# Patient Record
Sex: Male | Born: 1950 | Race: Black or African American | Hispanic: No | Marital: Single | Smoking: Never smoker
Health system: Southern US, Community
[De-identification: ages and names within clinical notes are randomized; demographics above are authoritative.]

---

## 2009-06-09 ENCOUNTER — Emergency Department (HOSPITAL_COMMUNITY): Admission: EM | Admit: 2009-06-09 | Discharge: 2009-06-09 | Payer: Self-pay | Admitting: Emergency Medicine

## 2010-07-05 LAB — URINALYSIS, ROUTINE W REFLEX MICROSCOPIC
Bilirubin Urine: NEGATIVE
Leukocytes, UA: NEGATIVE
Specific Gravity, Urine: 1.023 (ref 1.005–1.030)

## 2010-07-05 LAB — URINE MICROSCOPIC-ADD ON

## 2012-08-27 ENCOUNTER — Encounter (HOSPITAL_COMMUNITY): Payer: Self-pay | Admitting: Emergency Medicine

## 2012-08-27 ENCOUNTER — Emergency Department (HOSPITAL_COMMUNITY)
Admission: EM | Admit: 2012-08-27 | Discharge: 2012-08-27 | Discharge status: Against Medical Advice | Payer: Commercial Managed Care - PPO | Attending: Emergency Medicine | Admitting: Emergency Medicine

## 2012-08-27 DIAGNOSIS — Y9389 Activity, other specified: Secondary | ICD-10-CM | POA: Insufficient documentation

## 2012-08-27 DIAGNOSIS — Y929 Unspecified place or not applicable: Secondary | ICD-10-CM | POA: Insufficient documentation

## 2012-08-27 DIAGNOSIS — S91309A Unspecified open wound, unspecified foot, initial encounter: Secondary | ICD-10-CM | POA: Insufficient documentation

## 2012-08-27 DIAGNOSIS — W268XXA Contact with other sharp object(s), not elsewhere classified, initial encounter: Secondary | ICD-10-CM | POA: Insufficient documentation

## 2012-08-27 NOTE — ED Notes (Signed)
Patient is very questioning in triage states that he is does not want anything but a tetanus shot. He reports he does not want anything but the shot and he refuses everything else. Made the patient aware that he will need to be triaged to see a dr for the tetanus shot.

## 2012-08-27 NOTE — ED Notes (Signed)
Pt to window x 3 stating he could not wait and will go to doctor's office in the morning

## 2012-08-27 NOTE — ED Notes (Signed)
Patient  Reports that he stepped on a nail a few minuets ago and he wants a tetanus shot

## 2015-03-04 ENCOUNTER — Other Ambulatory Visit: Payer: Commercial Managed Care - PPO

## 2015-03-04 ENCOUNTER — Other Ambulatory Visit: Payer: Self-pay | Admitting: Internal Medicine

## 2015-03-04 DIAGNOSIS — L97909 Non-pressure chronic ulcer of unspecified part of unspecified lower leg with unspecified severity: Secondary | ICD-10-CM

## 2015-03-09 ENCOUNTER — Other Ambulatory Visit: Payer: Self-pay | Admitting: Internal Medicine

## 2015-03-09 DIAGNOSIS — L97909 Non-pressure chronic ulcer of unspecified part of unspecified lower leg with unspecified severity: Secondary | ICD-10-CM

## 2015-03-17 ENCOUNTER — Other Ambulatory Visit: Payer: Commercial Managed Care - PPO

## 2015-04-15 ENCOUNTER — Other Ambulatory Visit: Payer: Commercial Managed Care - PPO

## 2016-02-10 ENCOUNTER — Other Ambulatory Visit: Payer: Self-pay | Admitting: Internal Medicine

## 2016-02-10 DIAGNOSIS — R319 Hematuria, unspecified: Secondary | ICD-10-CM

## 2016-02-23 ENCOUNTER — Ambulatory Visit
Admission: RE | Admit: 2016-02-23 | Discharge: 2016-02-23 | Disposition: A | Discharge status: Home / Self Care | Payer: BLUE CROSS/BLUE SHIELD | Source: Ambulatory Visit | Attending: Internal Medicine | Admitting: Internal Medicine

## 2016-02-23 DIAGNOSIS — R319 Hematuria, unspecified: Secondary | ICD-10-CM

## 2017-02-09 DIAGNOSIS — I8310 Varicose veins of unspecified lower extremity with inflammation: Secondary | ICD-10-CM | POA: Diagnosis not present

## 2017-02-09 DIAGNOSIS — I10 Essential (primary) hypertension: Secondary | ICD-10-CM | POA: Diagnosis not present

## 2017-02-09 DIAGNOSIS — R600 Localized edema: Secondary | ICD-10-CM | POA: Diagnosis not present

## 2017-03-28 DIAGNOSIS — I8310 Varicose veins of unspecified lower extremity with inflammation: Secondary | ICD-10-CM | POA: Diagnosis not present

## 2017-03-28 DIAGNOSIS — R600 Localized edema: Secondary | ICD-10-CM | POA: Diagnosis not present

## 2017-03-28 DIAGNOSIS — Z131 Encounter for screening for diabetes mellitus: Secondary | ICD-10-CM | POA: Diagnosis not present

## 2017-03-28 DIAGNOSIS — Z125 Encounter for screening for malignant neoplasm of prostate: Secondary | ICD-10-CM | POA: Diagnosis not present

## 2017-03-28 DIAGNOSIS — N390 Urinary tract infection, site not specified: Secondary | ICD-10-CM | POA: Diagnosis not present

## 2017-03-28 DIAGNOSIS — Z130 Encounter for screening for diseases of the blood and blood-forming organs and certain disorders involving the immune mechanism: Secondary | ICD-10-CM | POA: Diagnosis not present

## 2017-03-28 DIAGNOSIS — I10 Essential (primary) hypertension: Secondary | ICD-10-CM | POA: Diagnosis not present

## 2017-04-26 DIAGNOSIS — R600 Localized edema: Secondary | ICD-10-CM | POA: Diagnosis not present

## 2017-04-26 DIAGNOSIS — Z7189 Other specified counseling: Secondary | ICD-10-CM | POA: Diagnosis not present

## 2017-04-26 DIAGNOSIS — I10 Essential (primary) hypertension: Secondary | ICD-10-CM | POA: Diagnosis not present

## 2017-04-26 DIAGNOSIS — E782 Mixed hyperlipidemia: Secondary | ICD-10-CM | POA: Diagnosis not present

## 2017-04-26 DIAGNOSIS — Z0000 Encounter for general adult medical examination without abnormal findings: Secondary | ICD-10-CM | POA: Diagnosis not present

## 2017-06-06 DIAGNOSIS — I10 Essential (primary) hypertension: Secondary | ICD-10-CM | POA: Diagnosis not present

## 2017-06-06 DIAGNOSIS — E782 Mixed hyperlipidemia: Secondary | ICD-10-CM | POA: Diagnosis not present

## 2017-07-20 DIAGNOSIS — I10 Essential (primary) hypertension: Secondary | ICD-10-CM | POA: Diagnosis not present

## 2017-07-20 DIAGNOSIS — E782 Mixed hyperlipidemia: Secondary | ICD-10-CM | POA: Diagnosis not present

## 2017-07-26 DIAGNOSIS — R600 Localized edema: Secondary | ICD-10-CM | POA: Diagnosis not present

## 2017-07-26 DIAGNOSIS — E782 Mixed hyperlipidemia: Secondary | ICD-10-CM | POA: Diagnosis not present

## 2017-07-26 DIAGNOSIS — I10 Essential (primary) hypertension: Secondary | ICD-10-CM | POA: Diagnosis not present

## 2017-07-26 DIAGNOSIS — I872 Venous insufficiency (chronic) (peripheral): Secondary | ICD-10-CM | POA: Diagnosis not present

## 2017-12-21 DIAGNOSIS — E782 Mixed hyperlipidemia: Secondary | ICD-10-CM | POA: Diagnosis not present

## 2017-12-21 DIAGNOSIS — I10 Essential (primary) hypertension: Secondary | ICD-10-CM | POA: Diagnosis not present

## 2017-12-21 DIAGNOSIS — I872 Venous insufficiency (chronic) (peripheral): Secondary | ICD-10-CM | POA: Diagnosis not present

## 2018-01-03 DIAGNOSIS — E782 Mixed hyperlipidemia: Secondary | ICD-10-CM | POA: Diagnosis not present

## 2018-01-03 DIAGNOSIS — I872 Venous insufficiency (chronic) (peripheral): Secondary | ICD-10-CM | POA: Diagnosis not present

## 2018-01-03 DIAGNOSIS — R600 Localized edema: Secondary | ICD-10-CM | POA: Diagnosis not present

## 2018-01-03 DIAGNOSIS — I10 Essential (primary) hypertension: Secondary | ICD-10-CM | POA: Diagnosis not present

## 2018-05-15 DIAGNOSIS — E782 Mixed hyperlipidemia: Secondary | ICD-10-CM | POA: Diagnosis not present

## 2018-05-15 DIAGNOSIS — I10 Essential (primary) hypertension: Secondary | ICD-10-CM | POA: Diagnosis not present

## 2018-05-15 DIAGNOSIS — I872 Venous insufficiency (chronic) (peripheral): Secondary | ICD-10-CM | POA: Diagnosis not present

## 2018-05-15 DIAGNOSIS — Z125 Encounter for screening for malignant neoplasm of prostate: Secondary | ICD-10-CM | POA: Diagnosis not present

## 2018-05-15 DIAGNOSIS — R600 Localized edema: Secondary | ICD-10-CM | POA: Diagnosis not present

## 2018-05-15 DIAGNOSIS — Z131 Encounter for screening for diabetes mellitus: Secondary | ICD-10-CM | POA: Diagnosis not present

## 2018-05-22 DIAGNOSIS — I10 Essential (primary) hypertension: Secondary | ICD-10-CM | POA: Diagnosis not present

## 2018-05-22 DIAGNOSIS — R600 Localized edema: Secondary | ICD-10-CM | POA: Diagnosis not present

## 2018-05-22 DIAGNOSIS — E782 Mixed hyperlipidemia: Secondary | ICD-10-CM | POA: Diagnosis not present

## 2018-05-22 DIAGNOSIS — Z0000 Encounter for general adult medical examination without abnormal findings: Secondary | ICD-10-CM | POA: Diagnosis not present

## 2018-09-12 DIAGNOSIS — R600 Localized edema: Secondary | ICD-10-CM | POA: Diagnosis not present

## 2018-09-12 DIAGNOSIS — Z7189 Other specified counseling: Secondary | ICD-10-CM | POA: Diagnosis not present

## 2018-09-12 DIAGNOSIS — I10 Essential (primary) hypertension: Secondary | ICD-10-CM | POA: Diagnosis not present

## 2018-09-12 DIAGNOSIS — I8310 Varicose veins of unspecified lower extremity with inflammation: Secondary | ICD-10-CM | POA: Diagnosis not present

## 2018-11-06 DIAGNOSIS — R600 Localized edema: Secondary | ICD-10-CM | POA: Diagnosis not present

## 2018-11-06 DIAGNOSIS — E782 Mixed hyperlipidemia: Secondary | ICD-10-CM | POA: Diagnosis not present

## 2018-11-06 DIAGNOSIS — I10 Essential (primary) hypertension: Secondary | ICD-10-CM | POA: Diagnosis not present

## 2018-11-13 DIAGNOSIS — Z7189 Other specified counseling: Secondary | ICD-10-CM | POA: Diagnosis not present

## 2018-11-13 DIAGNOSIS — I872 Venous insufficiency (chronic) (peripheral): Secondary | ICD-10-CM | POA: Diagnosis not present

## 2018-11-13 DIAGNOSIS — R600 Localized edema: Secondary | ICD-10-CM | POA: Diagnosis not present

## 2018-11-13 DIAGNOSIS — I10 Essential (primary) hypertension: Secondary | ICD-10-CM | POA: Diagnosis not present

## 2018-11-13 DIAGNOSIS — N183 Chronic kidney disease, stage 3 (moderate): Secondary | ICD-10-CM | POA: Diagnosis not present

## 2019-02-19 DIAGNOSIS — I872 Venous insufficiency (chronic) (peripheral): Secondary | ICD-10-CM | POA: Diagnosis not present

## 2019-02-19 DIAGNOSIS — I10 Essential (primary) hypertension: Secondary | ICD-10-CM | POA: Diagnosis not present

## 2019-02-19 DIAGNOSIS — R600 Localized edema: Secondary | ICD-10-CM | POA: Diagnosis not present

## 2019-02-26 DIAGNOSIS — I872 Venous insufficiency (chronic) (peripheral): Secondary | ICD-10-CM | POA: Diagnosis not present

## 2019-02-26 DIAGNOSIS — I10 Essential (primary) hypertension: Secondary | ICD-10-CM | POA: Diagnosis not present

## 2019-02-26 DIAGNOSIS — I8310 Varicose veins of unspecified lower extremity with inflammation: Secondary | ICD-10-CM | POA: Diagnosis not present

## 2019-02-26 DIAGNOSIS — E782 Mixed hyperlipidemia: Secondary | ICD-10-CM | POA: Diagnosis not present

## 2019-02-26 DIAGNOSIS — R600 Localized edema: Secondary | ICD-10-CM | POA: Diagnosis not present

## 2019-07-30 DIAGNOSIS — E782 Mixed hyperlipidemia: Secondary | ICD-10-CM | POA: Diagnosis not present

## 2019-07-30 DIAGNOSIS — I10 Essential (primary) hypertension: Secondary | ICD-10-CM | POA: Diagnosis not present

## 2019-07-30 DIAGNOSIS — I8310 Varicose veins of unspecified lower extremity with inflammation: Secondary | ICD-10-CM | POA: Diagnosis not present

## 2019-07-30 DIAGNOSIS — Z131 Encounter for screening for diabetes mellitus: Secondary | ICD-10-CM | POA: Diagnosis not present

## 2019-07-30 DIAGNOSIS — R739 Hyperglycemia, unspecified: Secondary | ICD-10-CM | POA: Diagnosis not present

## 2019-07-30 DIAGNOSIS — R600 Localized edema: Secondary | ICD-10-CM | POA: Diagnosis not present

## 2019-07-30 DIAGNOSIS — R39198 Other difficulties with micturition: Secondary | ICD-10-CM | POA: Diagnosis not present

## 2019-08-07 DIAGNOSIS — R600 Localized edema: Secondary | ICD-10-CM | POA: Diagnosis not present

## 2019-08-07 DIAGNOSIS — I10 Essential (primary) hypertension: Secondary | ICD-10-CM | POA: Diagnosis not present

## 2019-08-07 DIAGNOSIS — I872 Venous insufficiency (chronic) (peripheral): Secondary | ICD-10-CM | POA: Diagnosis not present

## 2019-08-07 DIAGNOSIS — Z0000 Encounter for general adult medical examination without abnormal findings: Secondary | ICD-10-CM | POA: Diagnosis not present

## 2019-09-04 DIAGNOSIS — N390 Urinary tract infection, site not specified: Secondary | ICD-10-CM | POA: Diagnosis not present

## 2019-09-04 DIAGNOSIS — R319 Hematuria, unspecified: Secondary | ICD-10-CM | POA: Diagnosis not present

## 2019-09-11 DIAGNOSIS — R600 Localized edema: Secondary | ICD-10-CM | POA: Diagnosis not present

## 2019-09-11 DIAGNOSIS — R319 Hematuria, unspecified: Secondary | ICD-10-CM | POA: Diagnosis not present

## 2019-09-11 DIAGNOSIS — I10 Essential (primary) hypertension: Secondary | ICD-10-CM | POA: Diagnosis not present

## 2019-11-07 DIAGNOSIS — R319 Hematuria, unspecified: Secondary | ICD-10-CM | POA: Diagnosis not present

## 2019-11-07 DIAGNOSIS — R311 Benign essential microscopic hematuria: Secondary | ICD-10-CM | POA: Diagnosis not present

## 2019-12-10 DIAGNOSIS — R311 Benign essential microscopic hematuria: Secondary | ICD-10-CM | POA: Diagnosis not present

## 2019-12-17 DIAGNOSIS — R311 Benign essential microscopic hematuria: Secondary | ICD-10-CM | POA: Diagnosis not present

## 2020-03-11 DIAGNOSIS — I10 Essential (primary) hypertension: Secondary | ICD-10-CM | POA: Diagnosis not present

## 2020-03-11 DIAGNOSIS — R600 Localized edema: Secondary | ICD-10-CM | POA: Diagnosis not present

## 2020-03-18 DIAGNOSIS — N182 Chronic kidney disease, stage 2 (mild): Secondary | ICD-10-CM | POA: Diagnosis not present

## 2020-03-18 DIAGNOSIS — N029 Recurrent and persistent hematuria with unspecified morphologic changes: Secondary | ICD-10-CM | POA: Diagnosis not present

## 2020-03-18 DIAGNOSIS — E782 Mixed hyperlipidemia: Secondary | ICD-10-CM | POA: Diagnosis not present

## 2020-03-18 DIAGNOSIS — I8310 Varicose veins of unspecified lower extremity with inflammation: Secondary | ICD-10-CM | POA: Diagnosis not present

## 2020-08-12 DIAGNOSIS — I10 Essential (primary) hypertension: Secondary | ICD-10-CM | POA: Diagnosis not present

## 2020-08-12 DIAGNOSIS — N182 Chronic kidney disease, stage 2 (mild): Secondary | ICD-10-CM | POA: Diagnosis not present

## 2020-08-12 DIAGNOSIS — E782 Mixed hyperlipidemia: Secondary | ICD-10-CM | POA: Diagnosis not present

## 2020-08-20 DIAGNOSIS — I10 Essential (primary) hypertension: Secondary | ICD-10-CM | POA: Diagnosis not present

## 2020-08-20 DIAGNOSIS — I8310 Varicose veins of unspecified lower extremity with inflammation: Secondary | ICD-10-CM | POA: Diagnosis not present

## 2020-08-20 DIAGNOSIS — N182 Chronic kidney disease, stage 2 (mild): Secondary | ICD-10-CM | POA: Diagnosis not present

## 2020-08-20 DIAGNOSIS — Z0000 Encounter for general adult medical examination without abnormal findings: Secondary | ICD-10-CM | POA: Diagnosis not present

## 2021-03-01 DIAGNOSIS — N182 Chronic kidney disease, stage 2 (mild): Secondary | ICD-10-CM | POA: Diagnosis not present

## 2021-03-01 DIAGNOSIS — I10 Essential (primary) hypertension: Secondary | ICD-10-CM | POA: Diagnosis not present

## 2021-03-01 DIAGNOSIS — Z0000 Encounter for general adult medical examination without abnormal findings: Secondary | ICD-10-CM | POA: Diagnosis not present

## 2021-03-01 DIAGNOSIS — I8310 Varicose veins of unspecified lower extremity with inflammation: Secondary | ICD-10-CM | POA: Diagnosis not present

## 2021-03-08 ENCOUNTER — Other Ambulatory Visit: Payer: Self-pay | Admitting: Internal Medicine

## 2021-03-08 DIAGNOSIS — I10 Essential (primary) hypertension: Secondary | ICD-10-CM | POA: Diagnosis not present

## 2021-03-08 DIAGNOSIS — R600 Localized edema: Secondary | ICD-10-CM | POA: Diagnosis not present

## 2021-03-08 DIAGNOSIS — R918 Other nonspecific abnormal finding of lung field: Secondary | ICD-10-CM | POA: Diagnosis not present

## 2021-03-08 DIAGNOSIS — E782 Mixed hyperlipidemia: Secondary | ICD-10-CM | POA: Diagnosis not present

## 2021-03-10 ENCOUNTER — Other Ambulatory Visit: Payer: Self-pay | Admitting: Internal Medicine

## 2021-03-10 DIAGNOSIS — R918 Other nonspecific abnormal finding of lung field: Secondary | ICD-10-CM

## 2021-03-10 DIAGNOSIS — I10 Essential (primary) hypertension: Secondary | ICD-10-CM | POA: Diagnosis not present

## 2021-03-10 DIAGNOSIS — E782 Mixed hyperlipidemia: Secondary | ICD-10-CM | POA: Diagnosis not present

## 2021-03-10 DIAGNOSIS — N183 Chronic kidney disease, stage 3 unspecified: Secondary | ICD-10-CM | POA: Diagnosis not present

## 2021-03-10 DIAGNOSIS — I872 Venous insufficiency (chronic) (peripheral): Secondary | ICD-10-CM | POA: Diagnosis not present

## 2021-07-24 ENCOUNTER — Encounter (HOSPITAL_COMMUNITY): Payer: Self-pay

## 2021-07-24 ENCOUNTER — Emergency Department (HOSPITAL_COMMUNITY): Payer: Medicare Other

## 2021-07-24 ENCOUNTER — Emergency Department (HOSPITAL_COMMUNITY)
Admission: EM | Admit: 2021-07-24 | Discharge: 2021-07-24 | Disposition: A | Discharge status: Home / Self Care | Payer: Medicare Other | Attending: Emergency Medicine | Admitting: Emergency Medicine

## 2021-07-24 ENCOUNTER — Other Ambulatory Visit: Payer: Self-pay

## 2021-07-24 DIAGNOSIS — N189 Chronic kidney disease, unspecified: Secondary | ICD-10-CM

## 2021-07-24 DIAGNOSIS — R519 Headache, unspecified: Secondary | ICD-10-CM | POA: Diagnosis not present

## 2021-07-24 DIAGNOSIS — I129 Hypertensive chronic kidney disease with stage 1 through stage 4 chronic kidney disease, or unspecified chronic kidney disease: Secondary | ICD-10-CM | POA: Diagnosis not present

## 2021-07-24 DIAGNOSIS — N183 Chronic kidney disease, stage 3 unspecified: Secondary | ICD-10-CM | POA: Insufficient documentation

## 2021-07-24 DIAGNOSIS — R7989 Other specified abnormal findings of blood chemistry: Secondary | ICD-10-CM | POA: Diagnosis not present

## 2021-07-24 DIAGNOSIS — E876 Hypokalemia: Secondary | ICD-10-CM | POA: Insufficient documentation

## 2021-07-24 DIAGNOSIS — E860 Dehydration: Secondary | ICD-10-CM | POA: Diagnosis not present

## 2021-07-24 DIAGNOSIS — N400 Enlarged prostate without lower urinary tract symptoms: Secondary | ICD-10-CM | POA: Diagnosis not present

## 2021-07-24 DIAGNOSIS — R42 Dizziness and giddiness: Secondary | ICD-10-CM | POA: Diagnosis not present

## 2021-07-24 DIAGNOSIS — R509 Fever, unspecified: Secondary | ICD-10-CM | POA: Diagnosis present

## 2021-07-24 DIAGNOSIS — I700 Atherosclerosis of aorta: Secondary | ICD-10-CM | POA: Diagnosis not present

## 2021-07-24 DIAGNOSIS — R7401 Elevation of levels of liver transaminase levels: Secondary | ICD-10-CM | POA: Insufficient documentation

## 2021-07-24 DIAGNOSIS — Z20822 Contact with and (suspected) exposure to covid-19: Secondary | ICD-10-CM | POA: Diagnosis not present

## 2021-07-24 DIAGNOSIS — R109 Unspecified abdominal pain: Secondary | ICD-10-CM | POA: Diagnosis not present

## 2021-07-24 LAB — URINALYSIS, ROUTINE W REFLEX MICROSCOPIC
Bacteria, UA: NONE SEEN
Bilirubin Urine: NEGATIVE
Glucose, UA: NEGATIVE mg/dL
Hgb urine dipstick: NEGATIVE
Ketones, ur: NEGATIVE mg/dL
Leukocytes,Ua: NEGATIVE
Nitrite: NEGATIVE
Protein, ur: 100 mg/dL — AB
Specific Gravity, Urine: 1.013 (ref 1.005–1.030)
pH: 8 (ref 5.0–8.0)

## 2021-07-24 LAB — COMPREHENSIVE METABOLIC PANEL
ALT: 127 U/L — ABNORMAL HIGH (ref 0–44)
AST: 66 U/L — ABNORMAL HIGH (ref 15–41)
Albumin: 3.5 g/dL (ref 3.5–5.0)
Alkaline Phosphatase: 50 U/L (ref 38–126)
Anion gap: 8 (ref 5–15)
BUN: 21 mg/dL (ref 8–23)
CO2: 36 mmol/L — ABNORMAL HIGH (ref 22–32)
Calcium: 9.3 mg/dL (ref 8.9–10.3)
Chloride: 98 mmol/L (ref 98–111)
Creatinine, Ser: 1.84 mg/dL — ABNORMAL HIGH (ref 0.61–1.24)
GFR, Estimated: 39 mL/min — ABNORMAL LOW (ref >60)
Glucose, Bld: 113 mg/dL — ABNORMAL HIGH (ref 70–99)
Potassium: 3.3 mmol/L — ABNORMAL LOW (ref 3.5–5.1)
Sodium: 142 mmol/L (ref 135–145)
Total Bilirubin: 0.9 mg/dL (ref 0.3–1.2)
Total Protein: 7.1 g/dL (ref 6.5–8.1)

## 2021-07-24 LAB — CBC WITH DIFFERENTIAL/PLATELET
Abs Immature Granulocytes: 0 10*3/uL (ref 0.00–0.07)
Basophils Absolute: 0 10*3/uL (ref 0.0–0.1)
Basophils Relative: 0 %
Eosinophils Absolute: 0.1 10*3/uL (ref 0.0–0.5)
Eosinophils Relative: 1 %
HCT: 40.9 % (ref 39.0–52.0)
Hemoglobin: 13.4 g/dL (ref 13.0–17.0)
Lymphocytes Relative: 18 %
Lymphs Abs: 1 10*3/uL (ref 0.7–4.0)
MCH: 28.5 pg (ref 26.0–34.0)
MCHC: 32.8 g/dL (ref 30.0–36.0)
MCV: 86.8 fL (ref 80.0–100.0)
Monocytes Absolute: 0.5 10*3/uL (ref 0.1–1.0)
Monocytes Relative: 9 %
Neutro Abs: 4.1 10*3/uL (ref 1.7–7.7)
Neutrophils Relative %: 72 %
Platelets: 320 10*3/uL (ref 150–400)
RBC: 4.71 MIL/uL (ref 4.22–5.81)
RDW: 12.6 % (ref 11.5–15.5)
WBC: 5.7 10*3/uL (ref 4.0–10.5)
nRBC: 0 % (ref 0.0–0.2)
nRBC: 1 /100 WBC — ABNORMAL HIGH

## 2021-07-24 LAB — RESP PANEL BY RT-PCR (FLU A&B, COVID) ARPGX2
Influenza A by PCR: NEGATIVE
Influenza B by PCR: NEGATIVE
SARS Coronavirus 2 by RT PCR: NEGATIVE

## 2021-07-24 LAB — LIPASE, BLOOD: Lipase: 48 U/L (ref 11–51)

## 2021-07-24 MED ORDER — LACTATED RINGERS IV BOLUS
1000.0000 mL | Freq: Once | INTRAVENOUS | Status: AC
  Administered 2021-07-24: 1000 mL via INTRAVENOUS

## 2021-07-24 NOTE — ED Provider Notes (Signed)
?[facility] ?Provider Note ? ? ?CSN: [id number] ?Arrival date & time: 07/24/21  1259 ? ?  ? ?History ? ?No chief complaint on file. ? ? ?Micheal Brady is a 71 y.o. male. ? ?Patient is a 71 year old male with a history of hypertension, chronic kidney disease stage III, who takes 3 different blood pressure medications including amlodipine, losartan and HCTZ who is presenting today with multiple vague complaints.  Patient reports his symptoms have been ongoing for at least a week he was concerned he may have COVID because he has had headache, low-grade fever, no appetite, dry mouth.  He denies any cough, chest pain or shortness of breath.  He denies any vomiting or diarrhea.  For me he denies abdominal pain however he did report that he has had abdominal pain for the last 1 week with the nurse in triage.  He also reports that he feels that he has had difficulty articulating his words.  He feels dizzy when he stands up to walk and states now he has to be careful with walking because he is afraid he may fall.  He denies any unilateral weakness or numbness.  No visual changes.  He has been taking these medications for quite some time and is not missed or changed any doses.  At work he does report he has been around multiple ill people but he has never felt like this before.  He denies any urinary symptoms.  He has no neck pain. ? ?The history is provided by the patient and medical records.  ? ?  ? ?Home Medications ?Prior to Admission medications   ?Not on File  ?   ? ?Allergies    ?Patient has no known allergies.   ? ?Review of Systems   ?Review of Systems ? ?Physical Exam ?Updated Vital Signs ?BP (!) 128/54 (BP Location: Right Arm)   Pulse 76   Temp 98.1 ?F (36.7 ?C) (Oral)   Resp 18   SpO2 99%  ?Physical Exam ?Vitals and nursing note reviewed.  ?Constitutional:   ?   General: He is not in acute distress. ?   Appearance: He is well-developed.  ?HENT:  ?   Head: Normocephalic  and atraumatic.  ?   Mouth/Throat:  ?   Mouth: Mucous membranes are dry.  ?Eyes:  ?   General: No visual field deficit. ?   Conjunctiva/sclera: Conjunctivae normal.  ?   Pupils: Pupils are equal, round, and reactive to light.  ?Cardiovascular:  ?   Rate and Rhythm: Normal rate and regular rhythm.  ?   Heart sounds: No murmur heard. ?Pulmonary:  ?   Effort: Pulmonary effort is normal. No respiratory distress.  ?   Breath sounds: Normal breath sounds. No wheezing or rales.  ?Abdominal:  ?   General: There is no distension.  ?   Palpations: Abdomen is soft.  ?   Tenderness: There is no abdominal tenderness. There is no guarding or rebound.  ?Musculoskeletal:     ?   General: No tenderness. Normal range of motion.  ?   Cervical back: Normal range of motion and neck supple.  ?Skin: ?   General: Skin is warm and dry.  ?   Findings: No erythema or rash.  ?Neurological:  ?   Mental Status: He is alert and oriented to person, place, and time.  ?   Cranial Nerves: No facial asymmetry.  ?   Sensory: Sensation is intact. No sensory deficit.  ?  Motor: Motor function is intact. No weakness.  ?   Coordination: Coordination is intact. Coordination normal. Finger-Nose-Finger Test and Heel to [facility] Test normal.  ?   Gait: Gait normal.  ?   Comments: At times he does appear to have some slight difficulty articulating what he wants to say but not consistently.  ?Psychiatric:     ?   Mood and Affect: Mood normal.     ?   Behavior: Behavior normal.  ? ? ?ED Results / Procedures / Treatments   ?Labs ?(all labs ordered are listed, but only abnormal results are displayed) ?Labs Reviewed  ?COMPREHENSIVE METABOLIC PANEL - Abnormal; Notable for the following components:  ?    Result Value  ? Potassium 3.3 (*)   ? CO2 36 (*)   ? Glucose, Bld 113 (*)   ? Creatinine, Ser 1.84 (*)   ? AST 66 (*)   ? ALT 127 (*)   ? GFR, Estimated 39 (*)   ? All other components within normal limits  ?CBC WITH DIFFERENTIAL/PLATELET - Abnormal; Notable for the  following components:  ? nRBC 1 (*)   ? All other components within normal limits  ?URINALYSIS, ROUTINE W REFLEX MICROSCOPIC - Abnormal; Notable for the following components:  ? Protein, ur 100 (*)   ? All other components within normal limits  ?RESP PANEL BY RT-PCR (FLU A&B, COVID) ARPGX2  ?LIPASE, BLOOD  ? ? ?EKG ?None ? ?Radiology ?CT ABDOMEN PELVIS WO CONTRAST ? ?Result Date: 07/24/2021 ?CLINICAL DATA:  Abdominal pain EXAM: CT ABDOMEN AND PELVIS WITHOUT CONTRAST TECHNIQUE: Multidetector CT imaging of the abdomen and pelvis was performed following the standard protocol without IV contrast. RADIATION DOSE REDUCTION: This exam was performed according to the departmental dose-optimization program which includes automated exposure control, adjustment of the mA and/or kV according to patient size and/or use of iterative reconstruction technique. COMPARISON:  CT abdomen and pelvis 12/10/2019 FINDINGS: Lower chest: No acute abnormality. Hepatobiliary: No focal liver abnormality is seen. No gallstones, gallbladder wall thickening, or biliary dilatation. Pancreas: Unremarkable. No pancreatic ductal dilatation or surrounding inflammatory changes. Spleen: Normal in size without focal abnormality. Adrenals/Urinary Tract: Adrenal glands appear normal. No nephrolithiasis or hydronephrosis identified bilaterally. A few hypodense renal cysts are visualized bilaterally, better seen on previous study measuring up to 1.7 cm in the lower left kidney and 1.4 cm in the upper right kidney. Urinary bladder is incompletely distended with diffuse wall thickening. Stomach/Bowel: No bowel obstruction, free air or pneumatosis. No bowel wall edema identified. Limited evaluation of bowel due to nondistention. Appendix not visualized. Vascular/Lymphatic: Aortic atherosclerosis. No enlarged abdominal or pelvic lymph nodes. Reproductive: Prostate gland is markedly enlarged measuring approximately 5.7 x 5.9 x 6.3 cm with severe protrusion into the  base of the urinary bladder. Other: No ascites Musculoskeletal: Degenerative changes in the lumbar spine. No suspicious bony lesions visualized. IMPRESSION: 1. No acute abnormality identified. 2. Marked prostatomegaly with severe protrusion into the base of the urinary bladder. 3. Diffuse urinary bladder wall thickening likely secondary to nondistention and chronic outlet obstruction. 4. Bilateral renal cysts. Electronically Signed   By: Ofilia Neas M.D.   On: 07/24/2021 18:05  ? ?CT Head Wo Contrast ? ?Result Date: 07/24/2021 ?CLINICAL DATA:  Headache and dizziness. Expressive aphasia symptoms for greater than one week. EXAM: CT HEAD WITHOUT CONTRAST TECHNIQUE: Contiguous axial images were obtained from the base of the skull through the vertex without intravenous contrast. RADIATION DOSE REDUCTION: This exam was performed according to the  departmental dose-optimization program which includes automated exposure control, adjustment of the mA and/or kV according to patient size and/or use of iterative reconstruction technique. COMPARISON:  None. FINDINGS: Brain: No evidence of acute infarction, hemorrhage, hydrocephalus, extra-axial collection or mass lesion/mass effect. Mild periventricular white matter hypoattenuation consistent with chronic microvascular ischemic change. Vascular: No hyperdense vessel or unexpected calcification. Skull: Normal. Negative for fracture or focal lesion. Sinuses/Orbits: Visualized globes and orbits are unremarkable. Visualized sinuses are clear. Other: None. IMPRESSION: 1.  No acute intracranial abnormalities. Electronically Signed   By: Lajean Manes M.D.   On: 07/24/2021 17:59   ? ?Procedures ?Procedures  ? ? ?Medications Ordered in ED ?Medications  ?lactated ringers bolus 1,000 mL (0 mLs Intravenous Stopped 07/24/21 1943)  ? ? ?ED Course/ Medical Decision Making/ A&P ?  ?                        ?Medical Decision Making ?Amount and/or Complexity of Data Reviewed ?External Data  Reviewed: notes. ?Labs: ordered. Decision-making details documented in ED Course. ?Radiology: ordered and independent interpretation performed. Decision-making details documented in ED Course. ? ?Risk ?Prescriptio

## 2021-07-24 NOTE — Discharge Instructions (Signed)
Hold your losartan until you follow-up with Dr. Marcelline Deist.  Continue to take your other blood pressure medication.  Continue to drink plenty of fluids.  Your regular doctor can continue to watch her prostate and make sure they do not need to do anything about that.  The scans today were otherwise normal. ?

## 2021-07-24 NOTE — ED Triage Notes (Addendum)
Patient complains of 1 week of abdominal pain with decreased appetite with headache and fever. Patient denies emesis and denies diarrhea. Alert and oriented ?Patient reports experiencing some expressive aphasia for greater than 1 week ?

## 2021-07-24 NOTE — ED Provider Triage Note (Signed)
Emergency Medicine Provider Triage Evaluation Note ? ?Micheal Brady , a 71 y.o. male  was evaluated in triage.  Pt complains of decreased appetite, dizziness, nightmares, difficulty sleeping. Says all of this has been going on for multiple weeks.  No known medical conditions.  Reports concerns that he is "having trouble getting out what he wants to say." ? ?Review of Systems  ?Positive: As above ?Negative: Abdominal pain, vomiting or diarrhea ? ?Physical Exam  ?BP 137/69 (BP Location: Right Arm)   Pulse 80   Temp 99.1 ?F (37.3 ?C) (Oral)   Resp 16   SpO2 96%  ?Gen:   Awake, no distress   ?Resp:  Normal effort  ?MSK:   Moves extremities without difficulty  ?Other:  Patient speaking in complete sentences.  Complaining of multiple things that have been going on for at least over a week.  Says he is unsure why he is not eating as much because he is not having abdominal pain or nausea. ? ?Medical Decision Making  ?Medically screening exam initiated at 1:38 PM.  Appropriate orders placed.  Micheal Brady was informed that the remainder of the evaluation will be completed by another provider, this initial triage assessment does not replace that evaluation, and the importance of remaining in the ED until their evaluation is complete. ? ? ? ?Multiple complaints, nonacute ?  ?Micheal Brady A, PA-C ?07/24/21 1340 ? ?

## 2021-08-26 ENCOUNTER — Emergency Department (HOSPITAL_BASED_OUTPATIENT_CLINIC_OR_DEPARTMENT_OTHER): Payer: Medicare Other

## 2021-08-26 ENCOUNTER — Emergency Department (HOSPITAL_COMMUNITY)
Admission: EM | Admit: 2021-08-26 | Discharge: 2021-08-26 | Disposition: A | Discharge status: Home / Self Care | Payer: Medicare Other | Attending: Emergency Medicine | Admitting: Emergency Medicine

## 2021-08-26 ENCOUNTER — Encounter (HOSPITAL_COMMUNITY): Payer: Self-pay | Admitting: Emergency Medicine

## 2021-08-26 DIAGNOSIS — M7989 Other specified soft tissue disorders: Secondary | ICD-10-CM

## 2021-08-26 DIAGNOSIS — M79605 Pain in left leg: Secondary | ICD-10-CM | POA: Insufficient documentation

## 2021-08-26 DIAGNOSIS — L03116 Cellulitis of left lower limb: Secondary | ICD-10-CM

## 2021-08-26 DIAGNOSIS — R600 Localized edema: Secondary | ICD-10-CM | POA: Insufficient documentation

## 2021-08-26 MED ORDER — DOXYCYCLINE HYCLATE 100 MG PO CAPS: 100.0000 mg | ORAL_CAPSULE | Freq: Two times a day (BID) | ORAL | 0 refills | Status: AC

## 2021-08-26 MED ORDER — DOXYCYCLINE HYCLATE 100 MG PO CAPS: 100.0000 mg | ORAL_CAPSULE | Freq: Two times a day (BID) | ORAL | 0 refills | Status: DC

## 2021-08-26 NOTE — ED Provider Notes (Signed)
Pt signed out by Dr. Adela Lank pending [place] report.  [place] reviewed.  I agree with the radiologist.    Summary:  RIGHT:  - No evidence of common femoral vein obstruction.  - Ultrasound characteristics of enlarged lymph nodes are noted in the groin.     LEFT:  - There is no evidence of deep vein thrombosis in the lower extremity.     - No cystic structure found in the popliteal fossa.  - Subcutaneous edema seen in area of the calf.    Pt d/c with doxy.  He is instructed to return if worse.  F/u with pcp.   Jacalyn Lefevre, MD 08/26/21 1635

## 2021-08-26 NOTE — ED Triage Notes (Signed)
Patient complains of left leg swelling that he noticed after mowing his yard on Sunday. Patient complains of pain in the toes of his left foot and an itching red lesion on his left thigh. Patient is alert, oriented, and in no apparent distress at this time.

## 2021-08-26 NOTE — Progress Notes (Signed)
LLE venous duplex has been completed.  Preliminary results given to Dr. Particia Nearing via secure chat.   Results can be found under chart review under CV PROC. 08/26/2021 4:36 PM Esthefany Herrig RVT, RDMS

## 2021-08-26 NOTE — ED Provider Notes (Signed)
[facility] EMERGENCY DEPARTMENT Provider Note   CSN: [id number] Arrival date & time: 08/26/21  1341     History  Chief Complaint  Patient presents with   Leg Swelling    Micheal Brady is a 71 y.o. male.  71 yo M with a chief complaints of left leg pain and swelling.  This been going on for about 5 days.  At the onset she had an area of erythema and itchiness to the groin on that side.  He thought maybe he had been bitten by a tick.  Since then the swelling has gotten worse.  The redness is gotten a little bit better.  He called his family doctor who suggested to come to the emergency department to be evaluated for a DVT.  He denies chest pain or shortness of breath.       Home Medications Prior to Admission medications   Medication Sig Start Date End Date Taking? Authorizing Provider  doxycycline (VIBRAMYCIN) 100 MG capsule Take 1 capsule (100 mg total) by mouth 2 (two) times daily. One po bid x 7 days 08/26/21  Yes Melene Plan, DO      Allergies    Patient has no known allergies.    Review of Systems   Review of Systems  Physical Exam Updated Vital Signs BP (!) 149/81 (BP Location: Left Arm)   Pulse 91   Temp 99 F (37.2 C) (Oral)   Resp 16   SpO2 99%  Physical Exam Vitals and nursing note reviewed.  Constitutional:      Appearance: He is well-developed.  HENT:     Head: Normocephalic and atraumatic.  Eyes:     Pupils: Pupils are equal, round, and reactive to light.  Neck:     Vascular: No JVD.  Cardiovascular:     Rate and Rhythm: Normal rate and regular rhythm.     Heart sounds: No murmur heard.   No friction rub. No gallop.  Pulmonary:     Effort: No respiratory distress.     Breath sounds: No wheezing.  Abdominal:     General: There is no distension.     Tenderness: There is no abdominal tenderness. There is no guarding or rebound.  Musculoskeletal:        General: Normal range of motion.     Cervical back: Normal range of motion  and neck supple.     Comments: Left lower extremity edema compared to right without any erythema or warmth.  Pulse motor and sensation intact.  Ambulates without issue.  No obvious area of erythema or lesion to the groin.  Skin:    Coloration: Skin is not pale.     Findings: No rash.  Neurological:     Mental Status: He is alert and oriented to person, place, and time.  Psychiatric:        Behavior: Behavior normal.    ED Results / Procedures / Treatments   Labs (all labs ordered are listed, but only abnormal results are displayed) Labs Reviewed - No data to display  EKG None  Radiology No results found.  Procedures Procedures    Medications Ordered in ED Medications - No data to display  ED Course/ Medical Decision Making/ A&P                           Medical Decision Making Risk Prescription drug management.   71 yo M with a chief complaints of left  leg swelling.  Is been going on for about 5 days now.  Noticed that after mowing the lawn.  He also thought that maybe he was bit by a tick because he had some redness and itchiness to the left groin.  He has no signs or symptoms consistent with a PE.  Not tachycardic or hypoxic.  We will obtain a DVT study.  Signed out to Dr. Particia Nearing, please see their note for further details of care in the ED.  The patients results and plan were reviewed and discussed.   Any x-rays performed were independently reviewed by myself.   Differential diagnosis were considered with the presenting HPI.  Medications - No data to display  Vitals:   08/26/21 1347  BP: (!) 149/81  Pulse: 91  Resp: 16  Temp: 99 F (37.2 C)  TempSrc: Oral  SpO2: 99%    Final diagnoses:  Leg edema            Final Clinical Impression(s) / ED Diagnoses Final diagnoses:  Leg edema    Rx / DC Orders ED Discharge Orders          Ordered    doxycycline (VIBRAMYCIN) 100 MG capsule  2 times daily        08/26/21 1427               Melene Plan, DO 08/26/21 1535

## 2021-09-07 DIAGNOSIS — R918 Other nonspecific abnormal finding of lung field: Secondary | ICD-10-CM | POA: Diagnosis not present

## 2021-09-07 DIAGNOSIS — R600 Localized edema: Secondary | ICD-10-CM | POA: Diagnosis not present

## 2021-09-07 DIAGNOSIS — I8310 Varicose veins of unspecified lower extremity with inflammation: Secondary | ICD-10-CM | POA: Diagnosis not present

## 2021-09-07 DIAGNOSIS — I872 Venous insufficiency (chronic) (peripheral): Secondary | ICD-10-CM | POA: Diagnosis not present

## 2021-09-16 DIAGNOSIS — R5383 Other fatigue: Secondary | ICD-10-CM | POA: Diagnosis not present

## 2021-09-16 DIAGNOSIS — N182 Chronic kidney disease, stage 2 (mild): Secondary | ICD-10-CM | POA: Diagnosis not present

## 2021-09-16 DIAGNOSIS — Z0000 Encounter for general adult medical examination without abnormal findings: Secondary | ICD-10-CM | POA: Diagnosis not present

## 2021-09-16 DIAGNOSIS — R600 Localized edema: Secondary | ICD-10-CM | POA: Diagnosis not present

## 2021-09-16 DIAGNOSIS — I10 Essential (primary) hypertension: Secondary | ICD-10-CM | POA: Diagnosis not present

## 2021-09-16 DIAGNOSIS — E782 Mixed hyperlipidemia: Secondary | ICD-10-CM | POA: Diagnosis not present

## 2021-09-23 DIAGNOSIS — I10 Essential (primary) hypertension: Secondary | ICD-10-CM | POA: Diagnosis not present

## 2021-09-23 DIAGNOSIS — Z0000 Encounter for general adult medical examination without abnormal findings: Secondary | ICD-10-CM | POA: Diagnosis not present

## 2021-09-23 DIAGNOSIS — N182 Chronic kidney disease, stage 2 (mild): Secondary | ICD-10-CM | POA: Diagnosis not present

## 2021-09-23 DIAGNOSIS — R918 Other nonspecific abnormal finding of lung field: Secondary | ICD-10-CM | POA: Diagnosis not present

## 2021-10-20 ENCOUNTER — Ambulatory Visit
Admission: RE | Admit: 2021-10-20 | Discharge: 2021-10-20 | Disposition: A | Discharge status: Home / Self Care | Payer: Medicare Other | Source: Ambulatory Visit | Attending: Internal Medicine | Admitting: Internal Medicine

## 2021-10-20 DIAGNOSIS — R918 Other nonspecific abnormal finding of lung field: Secondary | ICD-10-CM | POA: Diagnosis not present

## 2021-10-20 DIAGNOSIS — I700 Atherosclerosis of aorta: Secondary | ICD-10-CM | POA: Diagnosis not present

## 2022-04-28 DIAGNOSIS — R600 Localized edema: Secondary | ICD-10-CM | POA: Diagnosis not present

## 2022-04-28 DIAGNOSIS — I10 Essential (primary) hypertension: Secondary | ICD-10-CM | POA: Diagnosis not present

## 2022-04-28 DIAGNOSIS — N182 Chronic kidney disease, stage 2 (mild): Secondary | ICD-10-CM | POA: Diagnosis not present

## 2022-04-28 DIAGNOSIS — E782 Mixed hyperlipidemia: Secondary | ICD-10-CM | POA: Diagnosis not present

## 2022-05-05 DIAGNOSIS — I10 Essential (primary) hypertension: Secondary | ICD-10-CM | POA: Diagnosis not present

## 2022-05-05 DIAGNOSIS — R600 Localized edema: Secondary | ICD-10-CM | POA: Diagnosis not present

## 2022-05-05 DIAGNOSIS — N182 Chronic kidney disease, stage 2 (mild): Secondary | ICD-10-CM | POA: Diagnosis not present

## 2022-05-05 DIAGNOSIS — E782 Mixed hyperlipidemia: Secondary | ICD-10-CM | POA: Diagnosis not present

## 2022-09-12 DIAGNOSIS — K648 Other hemorrhoids: Secondary | ICD-10-CM | POA: Diagnosis not present

## 2022-09-12 DIAGNOSIS — Z09 Encounter for follow-up examination after completed treatment for conditions other than malignant neoplasm: Secondary | ICD-10-CM | POA: Diagnosis not present

## 2022-09-12 DIAGNOSIS — Z86010 Personal history of colonic polyps: Secondary | ICD-10-CM | POA: Diagnosis not present

## 2022-09-12 DIAGNOSIS — D122 Benign neoplasm of ascending colon: Secondary | ICD-10-CM | POA: Diagnosis not present

## 2022-09-14 DIAGNOSIS — D122 Benign neoplasm of ascending colon: Secondary | ICD-10-CM | POA: Diagnosis not present

## 2022-09-19 DIAGNOSIS — R5383 Other fatigue: Secondary | ICD-10-CM | POA: Diagnosis not present

## 2022-09-19 DIAGNOSIS — I10 Essential (primary) hypertension: Secondary | ICD-10-CM | POA: Diagnosis not present

## 2022-09-19 DIAGNOSIS — E782 Mixed hyperlipidemia: Secondary | ICD-10-CM | POA: Diagnosis not present

## 2022-09-19 DIAGNOSIS — N029 Recurrent and persistent hematuria with unspecified morphologic changes: Secondary | ICD-10-CM | POA: Diagnosis not present

## 2022-09-19 DIAGNOSIS — N182 Chronic kidney disease, stage 2 (mild): Secondary | ICD-10-CM | POA: Diagnosis not present

## 2022-09-19 DIAGNOSIS — I8310 Varicose veins of unspecified lower extremity with inflammation: Secondary | ICD-10-CM | POA: Diagnosis not present

## 2022-09-19 DIAGNOSIS — R600 Localized edema: Secondary | ICD-10-CM | POA: Diagnosis not present

## 2022-09-26 DIAGNOSIS — Z0000 Encounter for general adult medical examination without abnormal findings: Secondary | ICD-10-CM | POA: Diagnosis not present

## 2023-03-28 DIAGNOSIS — H524 Presbyopia: Secondary | ICD-10-CM | POA: Diagnosis not present

## 2023-04-17 DIAGNOSIS — I8310 Varicose veins of unspecified lower extremity with inflammation: Secondary | ICD-10-CM | POA: Diagnosis not present

## 2023-04-17 DIAGNOSIS — R918 Other nonspecific abnormal finding of lung field: Secondary | ICD-10-CM | POA: Diagnosis not present

## 2023-04-17 DIAGNOSIS — N029 Recurrent and persistent hematuria with unspecified morphologic changes: Secondary | ICD-10-CM | POA: Diagnosis not present

## 2023-04-17 DIAGNOSIS — I10 Essential (primary) hypertension: Secondary | ICD-10-CM | POA: Diagnosis not present

## 2023-04-17 DIAGNOSIS — K4021 Bilateral inguinal hernia, without obstruction or gangrene, recurrent: Secondary | ICD-10-CM | POA: Diagnosis not present

## 2023-04-17 DIAGNOSIS — R600 Localized edema: Secondary | ICD-10-CM | POA: Diagnosis not present

## 2023-04-17 DIAGNOSIS — N182 Chronic kidney disease, stage 2 (mild): Secondary | ICD-10-CM | POA: Diagnosis not present

## 2023-04-17 DIAGNOSIS — E782 Mixed hyperlipidemia: Secondary | ICD-10-CM | POA: Diagnosis not present

## 2023-04-17 DIAGNOSIS — I872 Venous insufficiency (chronic) (peripheral): Secondary | ICD-10-CM | POA: Diagnosis not present

## 2023-04-24 DIAGNOSIS — N029 Recurrent and persistent hematuria with unspecified morphologic changes: Secondary | ICD-10-CM | POA: Diagnosis not present

## 2023-04-24 DIAGNOSIS — N182 Chronic kidney disease, stage 2 (mild): Secondary | ICD-10-CM | POA: Diagnosis not present

## 2023-04-24 DIAGNOSIS — I872 Venous insufficiency (chronic) (peripheral): Secondary | ICD-10-CM | POA: Diagnosis not present

## 2023-04-24 DIAGNOSIS — K4021 Bilateral inguinal hernia, without obstruction or gangrene, recurrent: Secondary | ICD-10-CM | POA: Diagnosis not present

## 2023-04-24 DIAGNOSIS — G603 Idiopathic progressive neuropathy: Secondary | ICD-10-CM | POA: Diagnosis not present

## 2023-04-24 DIAGNOSIS — R7303 Prediabetes: Secondary | ICD-10-CM | POA: Diagnosis not present

## 2023-04-24 DIAGNOSIS — R918 Other nonspecific abnormal finding of lung field: Secondary | ICD-10-CM | POA: Diagnosis not present

## 2023-04-24 DIAGNOSIS — R600 Localized edema: Secondary | ICD-10-CM | POA: Diagnosis not present

## 2023-04-24 DIAGNOSIS — I8310 Varicose veins of unspecified lower extremity with inflammation: Secondary | ICD-10-CM | POA: Diagnosis not present

## 2023-04-24 DIAGNOSIS — I10 Essential (primary) hypertension: Secondary | ICD-10-CM | POA: Diagnosis not present

## 2023-04-24 DIAGNOSIS — E782 Mixed hyperlipidemia: Secondary | ICD-10-CM | POA: Diagnosis not present

## 2023-10-02 DIAGNOSIS — N182 Chronic kidney disease, stage 2 (mild): Secondary | ICD-10-CM | POA: Diagnosis not present

## 2023-10-02 DIAGNOSIS — N029 Recurrent and persistent hematuria with unspecified morphologic changes: Secondary | ICD-10-CM | POA: Diagnosis not present

## 2023-10-02 DIAGNOSIS — E782 Mixed hyperlipidemia: Secondary | ICD-10-CM | POA: Diagnosis not present

## 2023-10-02 DIAGNOSIS — R600 Localized edema: Secondary | ICD-10-CM | POA: Diagnosis not present

## 2023-10-02 DIAGNOSIS — R5383 Other fatigue: Secondary | ICD-10-CM | POA: Diagnosis not present

## 2023-10-02 DIAGNOSIS — R7303 Prediabetes: Secondary | ICD-10-CM | POA: Diagnosis not present

## 2023-10-02 DIAGNOSIS — I10 Essential (primary) hypertension: Secondary | ICD-10-CM | POA: Diagnosis not present

## 2023-10-02 DIAGNOSIS — I8310 Varicose veins of unspecified lower extremity with inflammation: Secondary | ICD-10-CM | POA: Diagnosis not present

## 2023-10-09 DIAGNOSIS — I872 Venous insufficiency (chronic) (peripheral): Secondary | ICD-10-CM | POA: Diagnosis not present

## 2023-10-09 DIAGNOSIS — E782 Mixed hyperlipidemia: Secondary | ICD-10-CM | POA: Diagnosis not present

## 2023-10-09 DIAGNOSIS — I8310 Varicose veins of unspecified lower extremity with inflammation: Secondary | ICD-10-CM | POA: Diagnosis not present

## 2023-10-09 DIAGNOSIS — K4021 Bilateral inguinal hernia, without obstruction or gangrene, recurrent: Secondary | ICD-10-CM | POA: Diagnosis not present

## 2023-10-09 DIAGNOSIS — R600 Localized edema: Secondary | ICD-10-CM | POA: Diagnosis not present

## 2023-10-09 DIAGNOSIS — N182 Chronic kidney disease, stage 2 (mild): Secondary | ICD-10-CM | POA: Diagnosis not present

## 2023-10-09 DIAGNOSIS — R918 Other nonspecific abnormal finding of lung field: Secondary | ICD-10-CM | POA: Diagnosis not present

## 2023-10-09 DIAGNOSIS — N029 Recurrent and persistent hematuria with unspecified morphologic changes: Secondary | ICD-10-CM | POA: Diagnosis not present

## 2023-10-09 DIAGNOSIS — Z0000 Encounter for general adult medical examination without abnormal findings: Secondary | ICD-10-CM | POA: Diagnosis not present

## 2023-10-09 DIAGNOSIS — I10 Essential (primary) hypertension: Secondary | ICD-10-CM | POA: Diagnosis not present

## 2023-11-23 IMAGING — CT CT HEAD W/O CM
3 series · 16 of 47 positions shown, 19 images · non-contrast
Comparison: None.

CLINICAL DATA: Headache and dizziness. Expressive aphasia symptoms
for greater than one week.



[Series 4: head 5.0 h30s · axial · 0.46mm/px · z∈[-47,+83]mm · 10 of 32 slices shown, 13 images]
[im 3/32  brain]
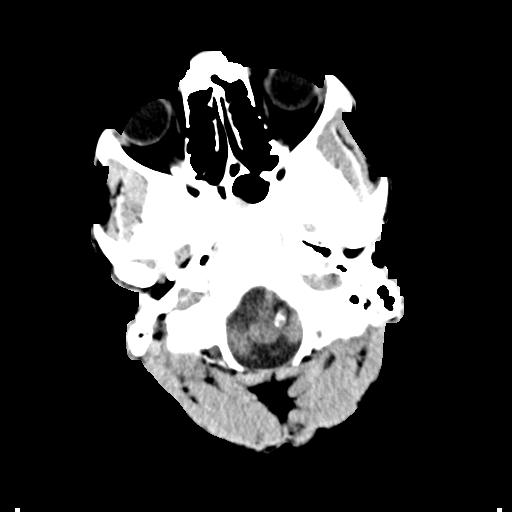
[im 3/32  bone]
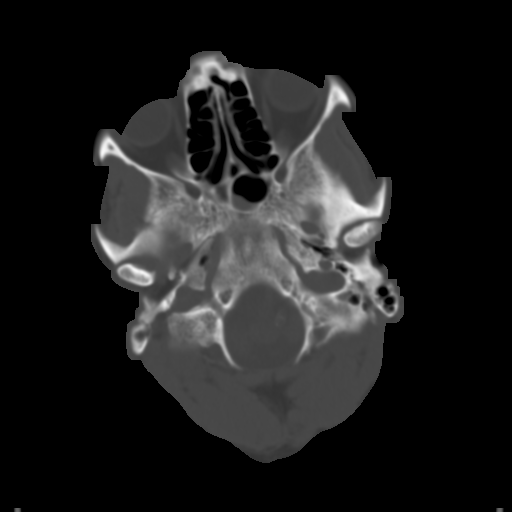
[im 6/32  brain]
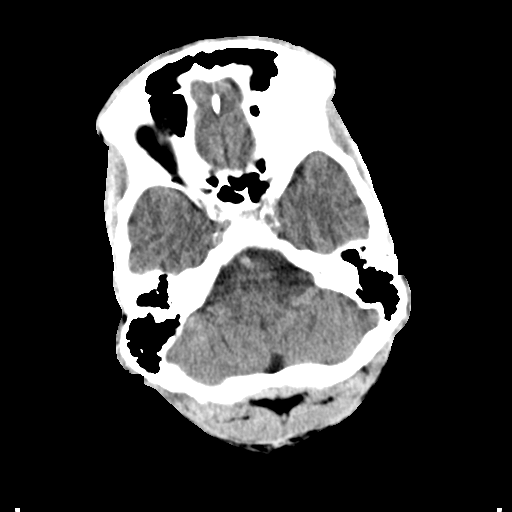
[im 9/32  brain]
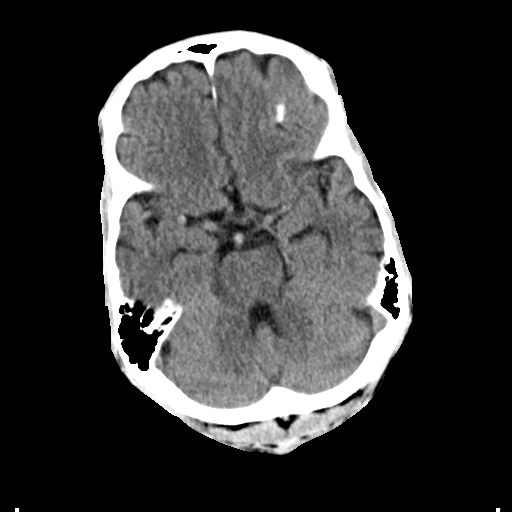
[im 11/32  brain]
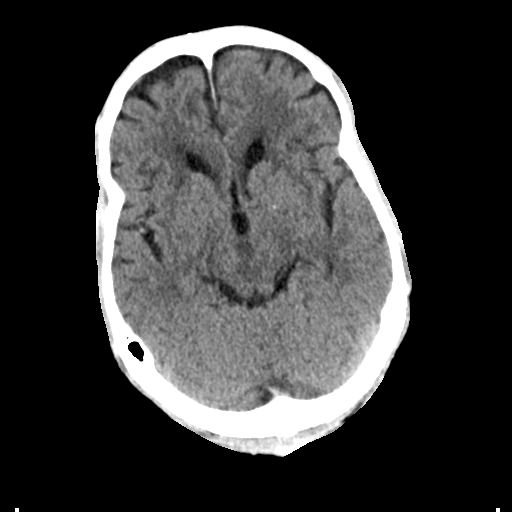
[im 14/32  brain]
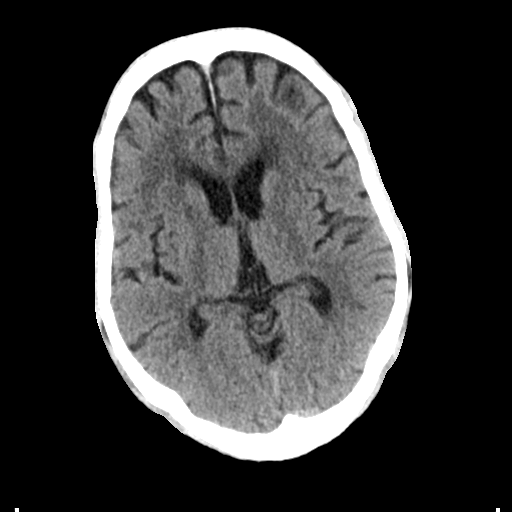
[im 14/32  bone]
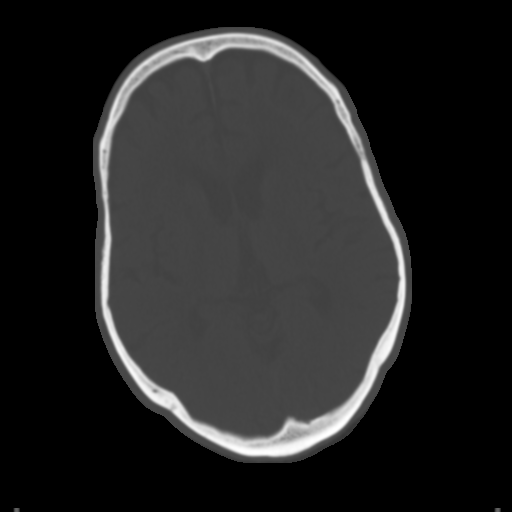
[im 18/32  brain]
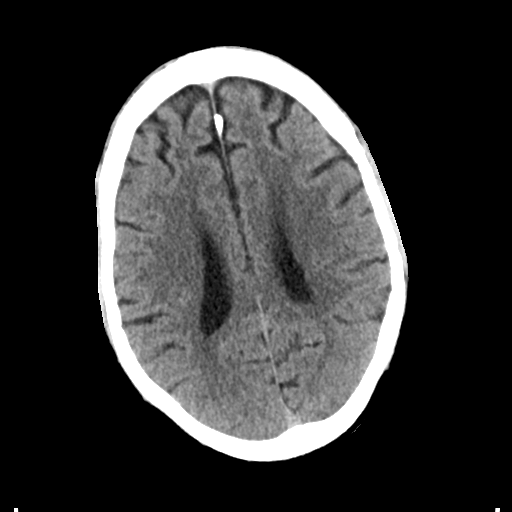
[im 21/32  brain]
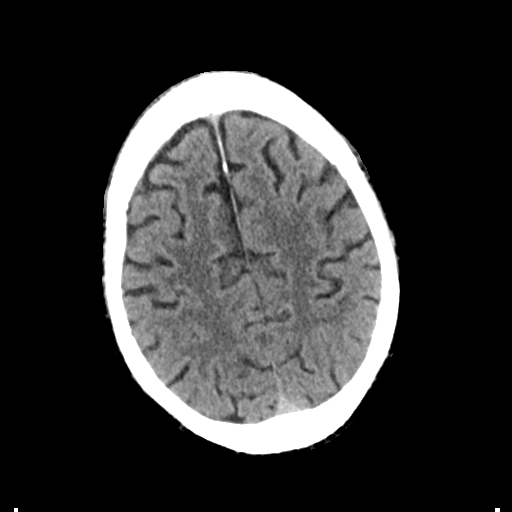
[im 24/32  brain]
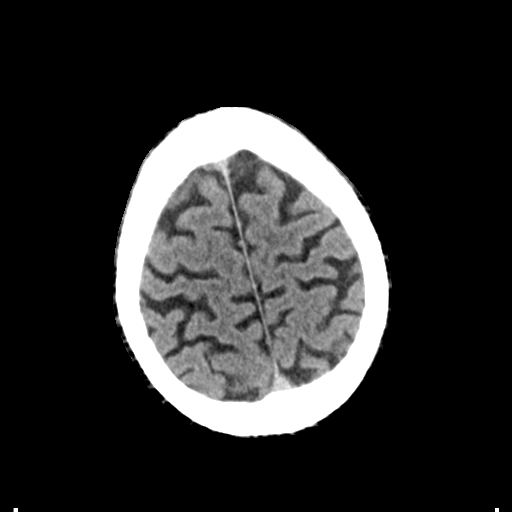
[im 26/32  brain]
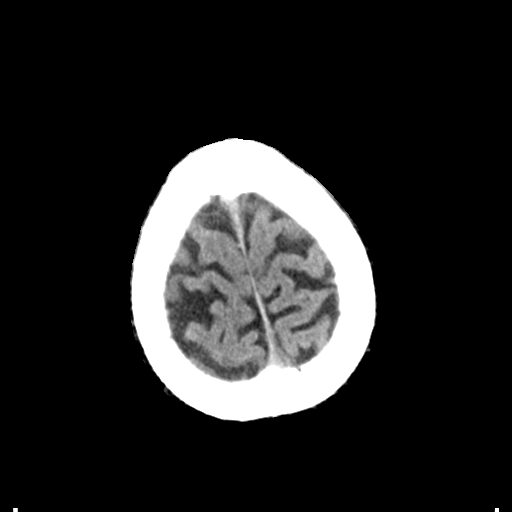
[im 26/32  bone]
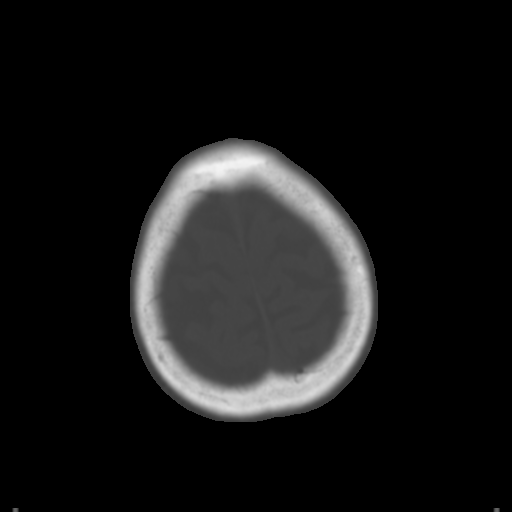
[im 29/32  brain]
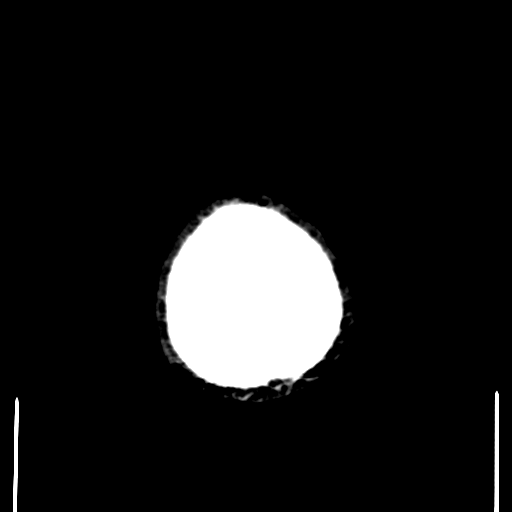

[Series 5: head 3.0 mpr cor · coronal · 0.33mm/px · 3 of 70 slices shown]
[im 24/70  brain]
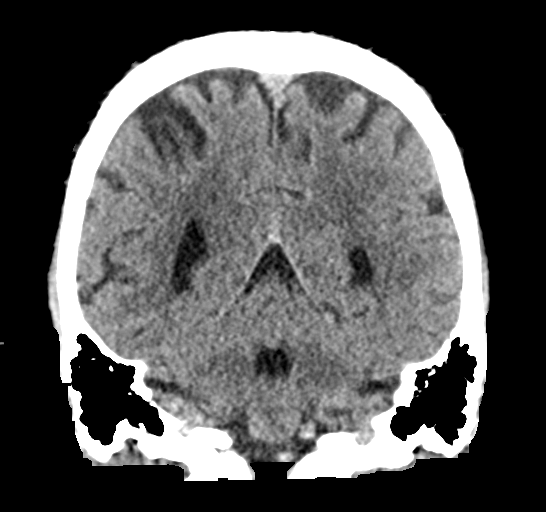
[im 31/70  brain]
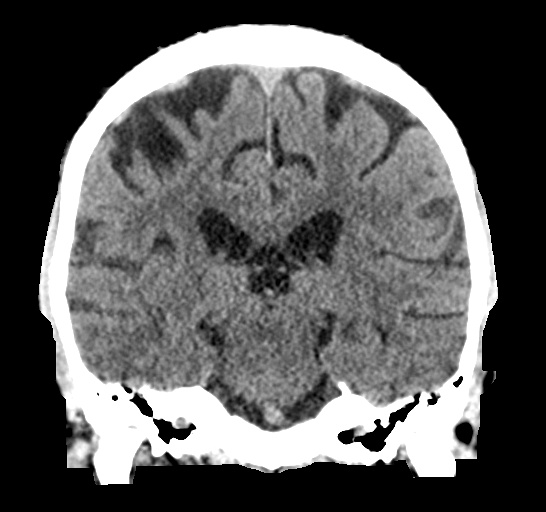
[im 39/70  brain]
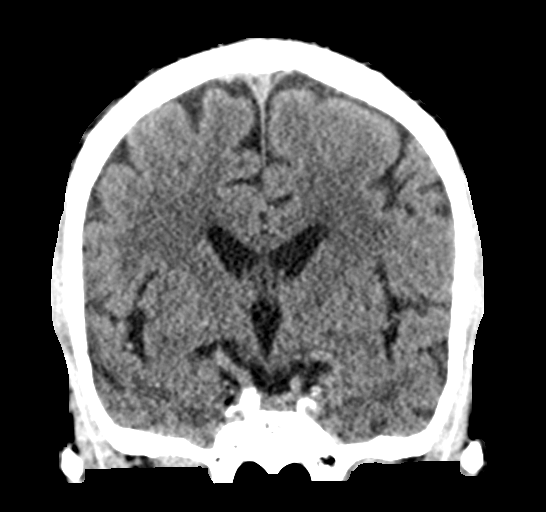

[Series 6: head 3.0 mpr sag · sagittal · 0.34mm/px · 3 of 56 slices shown]
[im 19/56  brain]
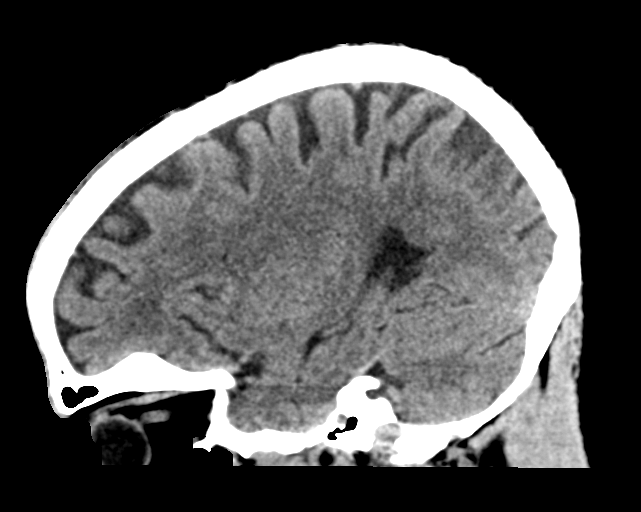
[im 28/56  brain]
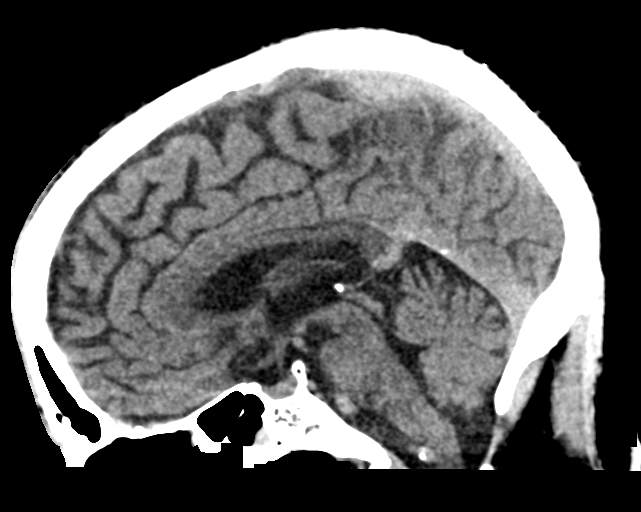
[im 37/56  brain]
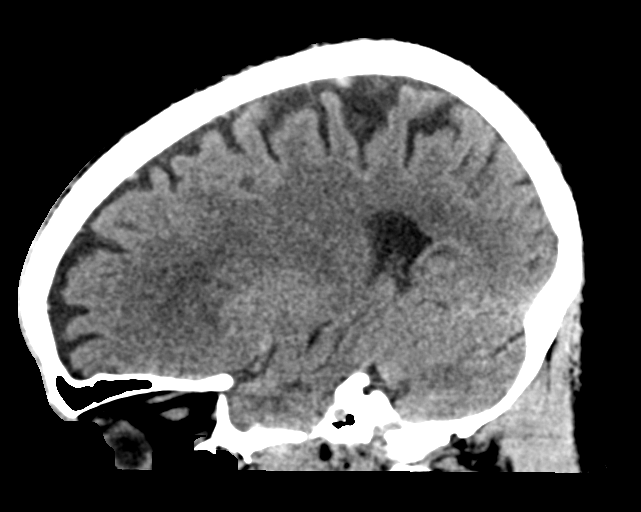

[16 of 47 positions shown; findings below may reference images not displayed]

FINDINGS: Brain: No evidence of acute infarction, hemorrhage, hydrocephalus,
extra-axial collection or mass lesion/mass effect.

Mild periventricular white matter hypoattenuation consistent with
chronic microvascular ischemic change.

Vascular: No hyperdense vessel or unexpected calcification.

Skull: Normal. Negative for fracture or focal lesion.

Sinuses/Orbits: Visualized globes and orbits are unremarkable.
Visualized sinuses are clear.

Other: None.
IMPRESSION: 1.  No acute intracranial abnormalities.
